# Patient Record
Sex: Male | Born: 2000 | Race: Black or African American | Hispanic: No | Marital: Single | Smoking: Never smoker
Health system: Southern US, Community
[De-identification: ages and names within clinical notes are randomized; demographics above are authoritative.]

---
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
## 2022-01-16 ENCOUNTER — Emergency Department (HOSPITAL_COMMUNITY)
Admission: EM | Admit: 2022-01-16 | Discharge: 2022-01-17 | Disposition: A | Discharge status: Home / Self Care | Payer: PRIVATE HEALTH INSURANCE | Attending: Emergency Medicine | Admitting: Emergency Medicine

## 2022-01-16 ENCOUNTER — Encounter (HOSPITAL_COMMUNITY): Payer: Self-pay | Admitting: *Deleted

## 2022-01-16 ENCOUNTER — Other Ambulatory Visit: Payer: Self-pay

## 2022-01-16 DIAGNOSIS — F321 Major depressive disorder, single episode, moderate: Secondary | ICD-10-CM | POA: Insufficient documentation

## 2022-01-16 DIAGNOSIS — F430 Acute stress reaction: Secondary | ICD-10-CM

## 2022-01-16 DIAGNOSIS — F4323 Adjustment disorder with mixed anxiety and depressed mood: Secondary | ICD-10-CM | POA: Diagnosis not present

## 2022-01-16 DIAGNOSIS — R45851 Suicidal ideations: Secondary | ICD-10-CM | POA: Insufficient documentation

## 2022-01-16 DIAGNOSIS — Z046 Encounter for general psychiatric examination, requested by authority: Secondary | ICD-10-CM | POA: Diagnosis present

## 2022-01-16 DIAGNOSIS — F4325 Adjustment disorder with mixed disturbance of emotions and conduct: Secondary | ICD-10-CM | POA: Insufficient documentation

## 2022-01-16 NOTE — ED Triage Notes (Signed)
Pt arrives via [employer] under IVC from magistrates office. Pt says that he has had "a lot of things built up and tonight I just let it all out". Pt is currently denying SI or HI. Report hx of si when he was younger. Not taking any medications on a daily basis. Alert and cooperative. ..   The respondent states that he is going to hurt himself. He states that he is going to walk out in the highway and hurt himself. The respondent has crying spells at times and states he is going to harm himself. He is not taking any medication at this time and not seeing  a doctor for any help. The respondent acted in a way that created a substantial risk of serious bodily harm and there is a reasonable probability that this conduct will be repeated.

## 2022-01-17 ENCOUNTER — Encounter (HOSPITAL_COMMUNITY): Payer: Self-pay | Admitting: Emergency Medicine

## 2022-01-17 DIAGNOSIS — F430 Acute stress reaction: Secondary | ICD-10-CM

## 2022-01-17 DIAGNOSIS — Z046 Encounter for general psychiatric examination, requested by authority: Secondary | ICD-10-CM

## 2022-01-17 DIAGNOSIS — F4323 Adjustment disorder with mixed anxiety and depressed mood: Secondary | ICD-10-CM

## 2022-01-17 LAB — SALICYLATE LEVEL: Salicylate Lvl: <7 mg/dL — ABNORMAL LOW (ref 7.0–30.0)

## 2022-01-17 LAB — CBC
HCT: 46.3 % (ref 39.0–52.0)
Hemoglobin: 15.1 g/dL (ref 13.0–17.0)
MCH: 28.5 pg (ref 26.0–34.0)
MCHC: 32.6 g/dL (ref 30.0–36.0)
MCV: 87.5 fL (ref 80.0–100.0)
Platelets: 257 10*3/uL (ref 150–400)
RBC: 5.29 MIL/uL (ref 4.22–5.81)
RDW: 12.6 % (ref 11.5–15.5)
WBC: 6.5 10*3/uL (ref 4.0–10.5)
nRBC: 0 % (ref 0.0–0.2)

## 2022-01-17 LAB — COMPREHENSIVE METABOLIC PANEL
ALT: 14 U/L (ref 0–44)
AST: 21 U/L (ref 15–41)
Albumin: 4.6 g/dL (ref 3.5–5.0)
Alkaline Phosphatase: 47 U/L (ref 38–126)
Anion gap: 7 (ref 5–15)
BUN: 12 mg/dL (ref 6–20)
CO2: 27 mmol/L (ref 22–32)
Calcium: 9.3 mg/dL (ref 8.9–10.3)
Chloride: 103 mmol/L (ref 98–111)
Creatinine, Ser: 1.09 mg/dL (ref 0.61–1.24)
GFR, Estimated: >60 mL/min (ref >60)
Glucose, Bld: 114 mg/dL — ABNORMAL HIGH (ref 70–99)
Potassium: 3.9 mmol/L (ref 3.5–5.1)
Sodium: 137 mmol/L (ref 135–145)
Total Bilirubin: 1 mg/dL (ref 0.3–1.2)
Total Protein: 7.7 g/dL (ref 6.5–8.1)

## 2022-01-17 LAB — RAPID URINE DRUG SCREEN, HOSP PERFORMED
Amphetamines: NOT DETECTED
Barbiturates: NOT DETECTED
Benzodiazepines: NOT DETECTED
Cocaine: NOT DETECTED
Opiates: NOT DETECTED
Tetrahydrocannabinol: NOT DETECTED

## 2022-01-17 LAB — ETHANOL: Alcohol, Ethyl (B): <10 mg/dL (ref <10)

## 2022-01-17 LAB — ACETAMINOPHEN LEVEL: Acetaminophen (Tylenol), Serum: <10 ug/mL — ABNORMAL LOW (ref 10–30)

## 2022-01-17 MED ORDER — ONDANSETRON 4 MG PO TBDP
4.0000 mg | ORAL_TABLET | Freq: Once | ORAL | Status: DC
  Filled 2022-01-17: qty 1

## 2022-01-17 NOTE — BH Assessment (Addendum)
Comprehensive Clinical Assessment (CCA) Note  01/17/2022 Greg Swanson [id number] Disposition: Clinician discussed patient care with Sindy Guadeloupe, NP.  He recommends patient to be observed overnight and be seen by psychiatry on 10/25.  Clinician informed Dr. Wallace Cullens of disposition recommendation via secure messaging.    Pt has good eye contact and is oriented x4.  Pt smiles during assessment and is responsive.  He does not appear to be responding to internal stimuli.  Patient does not evidence any delusional thought problems.  He is clear and coherent in his statements.  Patient reports that his appetite and sleep are WNL.  Patient does not have any current outpatient providers.   Chief Complaint:  Chief Complaint  Patient presents with   IVC   Visit Diagnosis: MDD single episode moderate    CCA Screening, Triage and Referral (STR)  Patient Reported Information How did you hear about [place]? Legal System  What Is the Reason for Your Visit/Call Today? Pt said that his mother became worried that he may harm himself.  Per IVC papers patient had said he would got to the highway to harm himself.  He had left the house upset and crying.  Patient has recently moved back home with mother after living on his own in [place].  Patient also had also broke up with a girlfriend today.  Patient says his mother was very concerned but he says that he never said anything to mother about wanting to harm himself.  He says he has never tried to harm himself.  Patient denies any HI or A/V hallucinations.  Patient denies any use of ETOH or marijuana.  Pt says he has no access to weapons.  His sleep is off due to not having to wake up early for his job.  Patient said he eats small meals througout the day.  How Long Has This Been Causing You Problems? <Week  What Do You Feel Would Help You the Most Today? Treatment for Depression or other mood problem   Have You Recently Had Any Thoughts About Hurting  Yourself? Yes  Are You Planning to Commit Suicide/Harm Yourself At This time? No   Have you Recently Had Thoughts About Hurting Someone Karolee Ohs? No  Are You Planning to Harm Someone at This Time? No  Explanation: No data recorded  Have You Used Any Alcohol or Drugs in the Past 24 Hours? No  How Long Ago Did You Use Drugs or Alcohol? No data recorded What Did You Use and How Much? No data recorded  Do You Currently Have a Therapist/Psychiatrist? No  Name of Therapist/Psychiatrist: No data recorded  Have You Been Recently Discharged From Any Office Practice or Programs? No  Explanation of Discharge From Practice/Program: No data recorded    CCA Screening Triage Referral Assessment Type of Contact: Tele-Assessment  Telemedicine Service Delivery:   Is this Initial or Reassessment? Initial Assessment  Date Telepsych consult ordered in [facility]:  01/17/22  Time Telepsych consult ordered in [facility]:  0110  Location of Assessment: [facility]  Provider Location: [facility] Assessment Services   Collateral Involvement: No data recorded  Does Patient Have a Court Appointed Legal Guardian? No  Legal Guardian Contact Information: No data recorded Copy of Legal Guardianship Form: No data recorded Legal Guardian Notified of Arrival: No data recorded Legal Guardian Notified of Pending Discharge: No data recorded If Minor and Not Living with Parent(s), Who has Custody? No data recorded Is CPS involved or ever been involved? Never  Is  APS involved or ever been involved? No data recorded  Patient Determined To Be At Risk for Harm To Self or Others Based on Review of Patient Reported Information or Presenting Complaint? Yes, for Self-Harm  Method: No data recorded Availability of Means: No data recorded Intent: No data recorded Notification Required: No data recorded Additional Information for Danger to Others Potential: No data recorded Additional Comments for Danger to Others Potential: No data  recorded Are There Guns or Other Weapons in Your Home? No data recorded Types of Guns/Weapons: No data recorded Are These Weapons Safely Secured?                            No data recorded Who Could Verify You Are Able To Have These Secured: No data recorded Do You Have any Outstanding Charges, Pending Court Dates, Parole/Probation? No data recorded Contacted To Inform of Risk of Harm To Self or Others: No data recorded   Does Patient Present under Involuntary Commitment? Yes  IVC Papers Initial File Date: 01/16/22   [place] of Residence: [place]   Patient Currently Receiving the Following Services: Not Receiving Services   Determination of Need: Urgent (48 hours)   Options For Referral: Other: Comment (Observe overnight and psychiatry to review on 10/25)     CCA Biopsychosocial Patient Reported Schizophrenia/Schizoaffective Diagnosis in Past: No   Strengths: No data recorded  Mental Health Symptoms Depression:   Change in energy/activity; Sleep (too much or little); Tearfulness; Fatigue   Duration of Depressive symptoms:  Duration of Depressive Symptoms: Greater than two weeks   Mania:   None   Anxiety:    Worrying; Sleep; Difficulty concentrating   Psychosis:   None   Duration of Psychotic symptoms:    Trauma:   None   Obsessions:   None   Compulsions:   None   Inattention:  No data recorded  Hyperactivity/Impulsivity:   None   Oppositional/Defiant Behaviors:   None   Emotional Irregularity:   Chronic feelings of emptiness   Other Mood/Personality Symptoms:  No data recorded   Mental Status Exam Appearance and self-care  Stature:   Average   Weight:   Average weight   Clothing:  No data recorded  Grooming:   Normal   Cosmetic use:   None   Posture/gait:   Normal   Motor activity:   Not Remarkable   Sensorium  Attention:   Normal   Concentration:   Anxiety interferes   Orientation:   X5   Recall/memory:    Normal   Affect and Mood  Affect:   Full Range   Mood:   Anxious   Relating  Eye contact:   Normal   Facial expression:   Responsive   Attitude toward examiner:   Cooperative   Thought and Language  Speech flow:  Clear and Coherent   Thought content:   Appropriate to Mood and Circumstances   Preoccupation:   None   Hallucinations:   None   Organization:   Coherent   [employer] of Knowledge:   Average   Intelligence:   Average   Abstraction:   Functional   Judgement:   Poor   Reality Testing:   Adequate   Insight:   Fair   Decision Making:   Impulsive   Social Functioning  Social Maturity:   Impulsive   Social Judgement:   Normal   Stress  Stressors:   Family conflict  Coping Ability:   Overwhelmed   Skill Deficits:   None   Supports:   Family     Religion: Religion/Spirituality Are You A Religious Person?: No  Leisure/Recreation: Leisure / Recreation Do You Have Hobbies?: No  Exercise/Diet: Exercise/Diet Do You Exercise?: No Have You Gained or Lost A Significant Amount of Weight in the Past Six Months?: No Do You Follow a Special Diet?: No Do You Have Any Trouble Sleeping?: No   CCA Employment/Education Employment/Work Situation: Employment / Work Situation Employment Situation: Unemployed Patient's Job has Been Impacted by Current Illness: No Has Patient ever Been in Passenger transport manager?: No  Education: Education Is Patient Currently Attending School?: No Last Grade Completed: 12 Did You Nutritional therapist?: No Did You Have An Individualized Education Program (IIEP): No Did You Have Any Difficulty At [employer]?: No Patient's Education Has Been Impacted by Current Illness: No   CCA Family/Childhood History Family and Relationship History: Family history Marital status: Single Does patient have children?: No  Childhood History:  Childhood History By whom was/is the patient raised?: Mother Did  patient suffer any verbal/emotional/physical/sexual abuse as a child?: No Did patient suffer from severe childhood neglect?: No Has patient ever been sexually abused/assaulted/raped as an adolescent or adult?: No Was the patient ever a victim of a crime or a disaster?: No Witnessed domestic violence?: No Has patient been affected by domestic violence as an adult?: No  Child/Adolescent Assessment:     CCA Substance Use Alcohol/Drug Use: Alcohol / Drug Use Pain Medications: None Prescriptions: Alergy mediation Over the Counter: None History of alcohol / drug use?: No history of alcohol / drug abuse                         ASAM's:  Six Dimensions of Multidimensional Assessment  Dimension 1:  Acute Intoxication and/or Withdrawal Potential:      Dimension 2:  Biomedical Conditions and Complications:      Dimension 3:  Emotional, Behavioral, or Cognitive Conditions and Complications:     Dimension 4:  Readiness to Change:     Dimension 5:  Relapse, Continued use, or Continued Problem Potential:     Dimension 6:  Recovery/Living Environment:     ASAM Severity Score:    ASAM Recommended Level of Treatment:     Substance use Disorder (SUD)    Recommendations for Services/Supports/Treatments:    Discharge Disposition:    DSM5 Diagnoses: Patient Active Problem List   Diagnosis Date Noted   Left hip pain 01/10/2015   Left knee pain 09/02/2013     Referrals to Alternative Service(s): Referred to Alternative Service(s):   Place:   Date:   Time:    Referred to Alternative Service(s):   Place:   Date:   Time:    Referred to Alternative Service(s):   Place:   Date:   Time:    Referred to Alternative Service(s):   Place:   Date:   Time:     Waldron Session

## 2022-01-17 NOTE — Discharge Instructions (Addendum)
It was our pleasure to provide your ER care today - we hope that you feel better.  Follow up closely with primary care doctor and behavioral health provider in the coming week.  For mental health issues and/or crisis, you may also go directly to the Behavioral [facility] Ur[facility] - they are open 24/7 and walk-ins are welcome.    Return to ER if worse, new symptoms, fevers, chest pain, trouble breathing, or other emergency concern.   

## 2022-01-17 NOTE — Consult Note (Signed)
[facility] ED ASSESSMENT   Reason for Consult:  IVC Referring Physician:  Wynona Dove, DO Patient Identification: Greg Swanson MRN:  [id number] ED Chief Complaint: Acute stress reaction with predominately emotional disturbance  Diagnosis:  Principal Problem:   Acute stress reaction with predominately emotional disturbance Active Problems:   Involuntary commitment   ED Assessment Time Calculation: Start Time: 0950 Stop Time: 1005 Total Time in Minutes (Assessment Completion): 15   Subjective:   Greg Swanson is a 21 y.o. male patient admitted with   HPI:   Patient reports that he moved to [place] from [place] approximately 3 weeks ago.  States that he is living with his mother.  States that his significant other broke up with him yesterday.  States that his mother became concerned because she was having difficulty Feeling him on his break-up.  He states that he left the house for approximately an hour to get something to eat and when he returned she took him to the police department because she was concerned.  He denies making any suicidal statements.  He denies making statements indicating that he wishes he was dead.  He denies a history of suicide attempts.  He denies a history of diagnosed psychiatric disorders.  He reports occasional depression and anxiety.  He denies anhedonia.  He denies issues with focusing and concentrating.  He denies racing thoughts outside of his break-up yesterday.  Jaydeen denies symptoms of mania or hypomania, including euphoric mood, sleeplessness, increased activity, impulsive/reckless behavior,  increased talkativeness, marked inability to focus and excessive self-confidence that have lasted for several consecutive days.  He denies use of alcohol, marijuana, cocaine, methamphetamine, and other illicit substances.  UDS negative.  BAL less than 10.  He expresses interest in therapy resources.  On evaluation, patient is alert and oriented x 4.  He is  neatly groomed.  Eye contact is good.  He is pleasant and cooperative.  Speech is clear and coherent.  He reports his mood this morning as euthymic.  Affect is congruent with mood.  Smiles appropriately.  Thought process is coherent, goal-directed, and linear.  Thought content is logical.  He denies auditory and visual hallucinations.  No indication that he is responding to internal stimuli.  No delusions elicited during this assessment.  He denies suicidal ideations.  He denies homicidal ideations.  Past Psychiatric History: Denies previous psychiatric history.   Risk to Self or Others: Is the patient at risk to self? Yes Has the patient been a risk to self in the past 6 months? No Has the patient been a risk to self within the distant past? No Is the patient a risk to others? No Has the patient been a risk to others in the past 6 months? No Has the patient been a risk to others within the distant past? No  [place] Scale:  [facility] ED from 01/16/2022 in [facility] DEPT  C-SSRS RISK CATEGORY Low Risk       AIMS:  , , ,  ,   ASAM:    Substance Abuse:  Alcohol / Drug Use Pain Medications: None Prescriptions: Alergy mediation Over the Counter: None History of alcohol / drug use?: No history of alcohol / drug abuse  Past Medical History: History reviewed. No pertinent past medical history. History reviewed. No pertinent surgical history. Family History: History reviewed. No pertinent family history. Family Psychiatric  History: Denies family history of Bipolar, Schizophrenia, and suicide.  Social History:  Social History  Substance and Sexual Activity  Alcohol Use No   Alcohol/week: 0.0 standard drinks of alcohol     Social History   Substance and Sexual Activity  Drug Use Not on file    Social History   Socioeconomic History   Marital status: Single    Spouse name: Not on file   Number of children: Not on file   Years of education:  Not on file   Highest education level: Not on file  Occupational History   Not on file  Tobacco Use   Smoking status: Never   Smokeless tobacco: Not on file  Substance and Sexual Activity   Alcohol use: No    Alcohol/week: 0.0 standard drinks of alcohol   Drug use: Not on file   Sexual activity: Not on file  Other Topics Concern   Not on file  Social History Narrative   ** Merged History Encounter **       Social Determinants of Health   Financial Resource Strain: Not on file  Food Insecurity: Not on file  Transportation Needs: Not on file  Physical Activity: Not on file  Stress: Not on file  Social Connections: Not on file   Additional Social History:    Allergies:   Allergies  Allergen Reactions   Penicillins Hives   Penicillins Hives    Has patient had a PCN reaction causing immediate rash, facial/tongue/throat swelling, SOB or lightheadedness with hypotension: No Has patient had a PCN reaction causing severe rash involving mucus membranes or skin necrosis: No Has patient had a PCN reaction that required hospitalization No Has patient had a PCN reaction occurring within the last 10 years: No If all of the above answers are "NO", then may proceed with Cephalosporin use.     Labs:  Results for orders placed or performed during the hospital encounter of 01/16/22 (from the past 48 hour(s))  Rapid urine drug screen (hospital performed)     Status: None   Collection Time: 01/16/22 12:30 AM  Result Value Ref Range   Opiates NONE DETECTED NONE DETECTED   Cocaine NONE DETECTED NONE DETECTED   Benzodiazepines NONE DETECTED NONE DETECTED   Amphetamines NONE DETECTED NONE DETECTED   Tetrahydrocannabinol NONE DETECTED NONE DETECTED   Barbiturates NONE DETECTED NONE DETECTED    Comment: (NOTE) DRUG SCREEN FOR MEDICAL PURPOSES ONLY.  IF CONFIRMATION IS NEEDED FOR ANY PURPOSE, NOTIFY LAB WITHIN 5 DAYS.  LOWEST DETECTABLE LIMITS FOR URINE DRUG SCREEN Drug Class                      Cutoff (ng/mL) Amphetamine and metabolites    1000 Barbiturate and metabolites    200 Benzodiazepine                 200 Opiates and metabolites        300 Cocaine and metabolites        300 THC                            50 Performed at [facility], [address] [place], [place] [address]   Comprehensive metabolic panel     Status: Abnormal   Collection Time: 01/17/22 12:15 AM  Result Value Ref Range   Sodium 137 135 - 145 mmol/L   Potassium 3.9 3.5 - 5.1 mmol/L   Chloride 103 98 - 111 mmol/L   CO2 27 22 - 32 mmol/L  Glucose, Bld 114 (H) 70 - 99 mg/dL    Comment: Glucose reference range applies only to samples taken after fasting for at least 8 hours.   BUN 12 6 - 20 mg/dL   Creatinine, Ser 1.09 0.61 - 1.24 mg/dL   Calcium 9.3 8.9 - 10.3 mg/dL   Total Protein 7.7 6.5 - 8.1 g/dL   Albumin 4.6 3.5 - 5.0 g/dL   AST 21 15 - 41 U/L   ALT 14 0 - 44 U/L   Alkaline Phosphatase 47 38 - 126 U/L   Total Bilirubin 1.0 0.3 - 1.2 mg/dL   GFR, Estimated >60 >60 mL/min    Comment: (NOTE) Calculated using the CKD-EPI Creatinine Equation (2021)    Anion gap 7 5 - 15    Comment: Performed at [facility], [place] [address] [place], [place] [address]  Ethanol     Status: None   Collection Time: 01/17/22 12:15 AM  Result Value Ref Range   Alcohol, Ethyl (B) <10 <10 mg/dL    Comment: (NOTE) Lowest detectable limit for serum alcohol is 10 mg/dL.  For medical purposes only. Performed at [facility], [place] [address], [place], [place] [address]   Salicylate level     Status: Abnormal   Collection Time: 01/17/22 12:15 AM  Result Value Ref Range   Salicylate Lvl <4.3 (L) 7.0 - 30.0 mg/dL    Comment: Performed at [facility], [place] [address], [place], [place] [address]  Acetaminophen level     Status: Abnormal   Collection Time: 01/17/22 12:15 AM  Result Value Ref Range   Acetaminophen (Tylenol), Serum  <10 (L) 10 - 30 ug/mL    Comment: (NOTE) Therapeutic concentrations vary significantly. A range of 10-30 ug/mL  may be an effective concentration for many patients. However, some  are best treated at concentrations outside of this range. Acetaminophen concentrations >150 ug/mL at 4 hours after ingestion  and >50 ug/mL at 12 hours after ingestion are often associated with  toxic reactions.  Performed at [facility], [place] [address], [place], [place] [address]   cbc     Status: None   Collection Time: 01/17/22 12:15 AM  Result Value Ref Range   WBC 6.5 4.0 - 10.5 K/uL   RBC 5.29 4.22 - 5.81 MIL/uL   Hemoglobin 15.1 13.0 - 17.0 g/dL   HCT 46.3 39.0 - 52.0 %   MCV 87.5 80.0 - 100.0 fL   MCH 28.5 26.0 - 34.0 pg   MCHC 32.6 30.0 - 36.0 g/dL   RDW 12.6 11.5 - 15.5 %   Platelets 257 150 - 400 K/uL   nRBC 0.0 0.0 - 0.2 %    Comment: Performed at [facility], [place] [address], [place], [place] [address]    No current facility-administered medications for this encounter.   Current Outpatient Medications  Medication Sig Dispense Refill   erythromycin ophthalmic ointment Place a 1/2 inch ribbon of ointment into the lower eyelid four times daily x 6 days (Patient not taking: Reported on 01/16/2022) 1 g 0   ibuprofen (ADVIL,MOTRIN) 600 MG tablet Take 1 tablet (600 mg total) by mouth every 6 (six) hours as needed. (Patient not taking: Reported on 01/16/2022) 30 tablet 0   methylphenidate 27 MG PO CR tablet Take 27 mg by mouth every morning. (Patient not taking: Reported on 01/16/2022)      Musculoskeletal: Strength & Muscle Tone: within normal limits Gait & Station:  normal Patient leans: N/A   Psychiatric Specialty Exam: Presentation  General Appearance:  Appropriate for Environment; Well Groomed  Eye Contact: Good  Speech: Clear and Coherent; Normal Rate  Speech Volume: Normal  Handedness:No data recorded  Mood and Affect   Mood: Euthymic  Affect: Congruent   Thought Process  Thought Processes: Coherent; Goal Directed; Linear  Descriptions of Associations:Intact  Orientation:Full (Time, Place and Person)  Thought Content:Logical  History of Schizophrenia/Schizoaffective disorder:No  Duration of Psychotic Symptoms:No data recorded Hallucinations:Hallucinations: None  Ideas of Reference:None  Suicidal Thoughts:Suicidal Thoughts: No  Homicidal Thoughts:Homicidal Thoughts: No   Sensorium  Memory: Immediate Good; Recent Good; Remote Good  Judgment: Fair  Insight: Fair   Community education officer  Concentration: Good  Attention Span: Good  Recall: Good  Fund of Knowledge: Good  Language: Good   Psychomotor Activity  Psychomotor Activity: Psychomotor Activity: Normal   Assets  Assets: Communication Skills; Desire for Improvement; Financial Resources/Insurance; Housing; Physical Health; Social Support    Sleep  Sleep: Sleep: Good   Physical Exam: Physical Exam Constitutional:      General: He is not in acute distress.    Appearance: He is not ill-appearing, toxic-appearing or diaphoretic.  HENT:     Right Ear: External ear normal.     Left Ear: External ear normal.  Eyes:     General:        Right eye: No discharge.        Left eye: No discharge.  Cardiovascular:     Rate and Rhythm: Normal rate.  Pulmonary:     Effort: Pulmonary effort is normal. No respiratory distress.  Musculoskeletal:        General: Normal range of motion.     Cervical back: Normal range of motion.  Neurological:     Mental Status: He is alert and oriented to person, place, and time.  Psychiatric:        Speech: Speech normal.        Behavior: Behavior is cooperative.        Thought Content: Thought content is not paranoid or delusional. Thought content does not include homicidal or suicidal ideation.    Review of Systems  Constitutional:  Negative for chills, diaphoresis,  fever, malaise/fatigue and weight loss.  Respiratory:  Negative for cough and shortness of breath.   Cardiovascular:  Negative for chest pain.  Gastrointestinal:  Negative for diarrhea, nausea and vomiting.  Neurological:  Negative for dizziness and seizures.  Psychiatric/Behavioral:  Positive for depression. Negative for hallucinations, memory loss, substance abuse and suicidal ideas. The patient is nervous/anxious. The patient does not have insomnia.    Blood pressure 126/84, pulse 90, temperature 98.9 F (37.2 C), temperature source Oral, resp. rate 20, height 5\' 6"  (1.676 m), weight 59 kg, SpO2 100 %. Body mass index is 20.98 kg/m.  Demographic Factors:  Male, Adolescent or young adult, and Unemployed  Loss Factors: Loss of significant relationship  Historical Factors: NA  Risk Reduction Factors:   Religious beliefs about death, Living with another person, especially a relative, and Positive social support  Continued Clinical Symptoms:  Reports depression/anxiety at times. Currently denies depression/anxiety.  Cognitive Features That Contribute To Risk:  None    Suicide Risk:  Mild:  Suicidal ideation of limited frequency, intensity, duration, and specificity.  There are no identifiable plans, no associated intent, mild dysphoria and related symptoms, good self-control (both objective and subjective assessment), few other risk factors, and identifiable protective factors, including available and accessible social  support.    Disposition: After patient was evaluated by this provider, it was learned that the EDP Dr. Ashok Cordia had rescinded the IVC and discharged the patient.    Lindon Romp, APRN, FNP-C, PMHNP-BC [place]  01/17/2022 11:22 AM

## 2022-01-17 NOTE — ED Provider Notes (Signed)
Emergency Medicine Observation Re-evaluation Note  Greg Swanson is a 21 y.o. male, seen on rounds today.  Pt initially presented to the ED for complaints of IVC Pt reports feeling fine/improved, and indicates he never had plan or desire to harm self. Pt indicates he was experiencing feeling upset/stress regarding break up of a relationship, but that he never had plan or wish to harm or kill self or others. He indicates he was upset, and that parent was going to call the hospital, and he didn't feel that was necessary so her walked out. Pt denies hx mental health illness. Indicates feels fine, normal appetite, sleeps fine at night. Denies recent physical illness.   Physical Exam  BP 126/84 (BP Location: Left Arm)   Pulse 90   Temp 98.9 F (37.2 C) (Oral)   Resp 20   Ht 1.676 m (5\' 6" )   Wt 59 kg   SpO2 100%   BMI 20.98 kg/m  Physical Exam General: alert, content.  Cardiac: regular rate. Lungs: breathing comfortably. Psych: pt exhibits normal mood and affect. Pt does not appear acutely depressed or despondent. He is conversant, pleasant, smiles, expresses thoughts clearly. No thoughts or plan to harm self or others. Pt is not responding to internal stimuli - no delusions or hallucinations are noted. No acute psychosis.   ED Course / MDM    I have reviewed the labs performed to date as well as medications administered while in observation.  Recent changes in the last 24 hours include ED obs, reassessment.   Plan  Pt without acute psychosis, acute depression, or thoughts of harm to self or others.   IVC rescinded. Paperwork given to staff.   Pt is offered outpatient [facility].   Pt currently appears stable for d/c.       Lajean Saver, MD 01/17/22 [phone]

## 2022-01-17 NOTE — ED Provider Notes (Signed)
[facility] DEPT Provider Note   CSN: [id number] Arrival date & time: 01/16/22  2323     History  Chief Complaint  Patient presents with   IVC    Greg Swanson is a 21 y.o. male.  Patient as above with significant medical history as below, including no sig med hx who presents to the ED with complaint of IVC. Pt reports recent increase in social stressors, recently lost his job, moved back home with mother, and earlier today had a breakup. Pt at this time denies and SI or HI, no recent etoh or illicit substance use. Mother did take pt to police department as she was concerned that he was a danger to himself, report that pt had reported he was going to run out into traffic to harm himself.  Pt denies this plan, denies any history of self harm or previous suicide attempt. Otherwise he has no acute complaints.    History reviewed. No pertinent past medical history.  History reviewed. No pertinent surgical history.   The history is provided by the patient. No language interpreter was used.       Home Medications Prior to Admission medications   Medication Sig Start Date End Date Taking? Authorizing Provider  erythromycin ophthalmic ointment Place a 1/2 inch ribbon of ointment into the lower eyelid four times daily x 6 days Patient not taking: Reported on 01/16/2022 06/30/15   [place], PA-C  ibuprofen (ADVIL,MOTRIN) 600 MG tablet Take 1 tablet (600 mg total) by mouth every 6 (six) hours as needed. Patient not taking: Reported on 01/16/2022 12/21/14   Charlann Lange, PA-C  methylphenidate 27 MG PO CR tablet Take 27 mg by mouth every morning. Patient not taking: Reported on 01/16/2022    [provider]      Allergies    Penicillins and Penicillins    Review of Systems   Review of Systems  Constitutional:  Negative for chills and fever.  HENT:  Negative for facial swelling and trouble swallowing.   Eyes:  Negative for  photophobia and visual disturbance.  Respiratory:  Negative for cough and shortness of breath.   Cardiovascular:  Negative for chest pain and palpitations.  Gastrointestinal:  Negative for abdominal pain, nausea and vomiting.  Endocrine: Negative for polydipsia and polyuria.  Genitourinary:  Negative for difficulty urinating and hematuria.  Musculoskeletal:  Negative for gait problem and joint swelling.  Skin:  Negative for pallor and rash.  Neurological:  Negative for syncope and headaches.  Psychiatric/Behavioral:  Positive for sleep disturbance. Negative for agitation and confusion. The patient is nervous/anxious.     Physical Exam Updated Vital Signs BP 126/84 (BP Location: Left Arm)   Pulse 90   Temp 98.9 F (37.2 C) (Oral)   Resp 20   Ht 5\' 6"  (1.676 m)   Wt 59 kg   SpO2 100%   BMI 20.98 kg/m  Physical Exam Vitals and nursing note reviewed.  Constitutional:      General: He is not in acute distress.    Appearance: He is well-developed.  HENT:     Head: Normocephalic and atraumatic.     Right Ear: External ear normal.     Left Ear: External ear normal.     Mouth/Throat:     Mouth: Mucous membranes are moist.  Eyes:     General: No scleral icterus. Cardiovascular:     Rate and Rhythm: Normal rate and regular rhythm.     Pulses: Normal  pulses.     Heart sounds: Normal heart sounds.  Pulmonary:     Effort: Pulmonary effort is normal. No respiratory distress.     Breath sounds: Normal breath sounds.  Abdominal:     General: Abdomen is flat.     Palpations: Abdomen is soft.     Tenderness: There is no abdominal tenderness.  Musculoskeletal:        General: Normal range of motion.     Cervical back: Normal range of motion.     Right lower leg: No edema.     Left lower leg: No edema.  Skin:    General: Skin is warm and dry.     Capillary Refill: Capillary refill takes less than 2 seconds.  Neurological:     Mental Status: He is alert and oriented to person,  place, and time.     GCS: GCS eye subscore is 4. GCS verbal subscore is 5. GCS motor subscore is 6.  Psychiatric:        Attention and Perception: He does not perceive auditory or visual hallucinations.        Mood and Affect: Mood is depressed.        Speech: Speech normal.        Behavior: Behavior normal. Behavior is cooperative.        Thought Content: Thought content does not include homicidal or suicidal ideation. Thought content does not include homicidal or suicidal plan.     ED Results / Procedures / Treatments   Labs (all labs ordered are listed, but only abnormal results are displayed) Labs Reviewed  COMPREHENSIVE METABOLIC PANEL - Abnormal; Notable for the following components:      Result Value   Glucose, Bld 114 (*)    All other components within normal limits  SALICYLATE LEVEL - Abnormal; Notable for the following components:   Salicylate Lvl <7.0 (*)    All other components within normal limits  ACETAMINOPHEN LEVEL - Abnormal; Notable for the following components:   Acetaminophen (Tylenol), Serum <10 (*)    All other components within normal limits  ETHANOL  CBC  RAPID URINE DRUG SCREEN, HOSP PERFORMED    EKG EKG Interpretation  Date/Time:  Wednesday January 17 2022 00:51:23 EDT Ventricular Rate:  72 PR Interval:  154 QRS Duration: 97 QT Interval:  353 QTC Calculation: 387 R Axis:   78 Text Interpretation: Sinus rhythm ST elev, probable normal early repol pattern No old tracing to compare no stemi Early repolarization Confirmed by Tanda Rockers ([id number]) on 01/17/2022 2:13:28 AM  Radiology No results found.  Procedures Procedures    Medications Ordered in ED Medications - No data to display  ED Course/ Medical Decision Making/ A&P                           Medical Decision Making Amount and/or Complexity of Data Reviewed Labs: ordered.   This patient presents to the ED with chief complaint(s) of IVC, suicidal with pertinent past medical history  of above which further complicates the presenting complaint. The complaint involves an extensive differential diagnosis and also carries with it a high risk of complications and morbidity.    The differential diagnosis includes but not limited to psychosis, substance induced, mental health disturbance, etc. Serious etiologies were considered.   The initial plan is to screening labs/ecg/tts   Additional history obtained: Additional history obtained from  na Records reviewed  prior ed visits, prior pcp notes  home meds, prior labs/imaging   Independent labs interpretation:  The following labs were independently interpreted:  Labs stable Apap, salicylate, ethanol were negative UDS negative CMP and CBC were stable  Independent visualization of imaging:  Pulse oximetry 100% on room air  Treatment and Reassessment: symptoms stated this time TTS eval  Consultation: - Consulted or discussed management/test interpretation w/ external professional: TTS > recommend continued observation and re-assess in the AM  Consideration for admission or further workup: Admission was considered   Plan for reassessment in the morning.  Patient is medically cleared.  He is not acutely psychotic.    Social Determinants of health: Social History   Tobacco Use   Smoking status: Never  Substance Use Topics   Alcohol use: No    Alcohol/week: 0.0 standard drinks of alcohol            Final Clinical Impression(s) / ED Diagnoses Final diagnoses:  Suicidal thoughts    Rx / DC Orders ED Discharge Orders     None         Sloan Leiter, DO 01/17/22 0932

## 2022-01-17 NOTE — ED Notes (Signed)
Pt belonging bag x1 placed in triage pt belonging cabinet

## 2023-02-04 ENCOUNTER — Ambulatory Visit
Admission: EM | Admit: 2023-02-04 | Discharge: 2023-02-04 | Disposition: A | Discharge status: Home / Self Care | Payer: PRIVATE HEALTH INSURANCE | Attending: Family Medicine | Admitting: Family Medicine

## 2023-02-04 DIAGNOSIS — R519 Headache, unspecified: Secondary | ICD-10-CM | POA: Diagnosis not present

## 2023-02-04 MED ORDER — TIZANIDINE HCL 4 MG PO TABS: 4.0000 mg | ORAL_TABLET | Freq: Three times a day (TID) | ORAL | 0 refills | Status: AC | PRN

## 2023-02-04 MED ORDER — IBUPROFEN 600 MG PO TABS: 600.0000 mg | ORAL_TABLET | Freq: Four times a day (QID) | ORAL | 0 refills | Status: AC | PRN

## 2023-02-04 NOTE — Discharge Instructions (Signed)
You were seen today for headache.  I have sent out a motrin and a muscle relaxer.  The muscle relaxer could make you tired/sleepy so please take when home and not driving.  You may use heat/ice as well.  Please return if not improving as expected.

## 2023-02-04 NOTE — ED Triage Notes (Signed)
Pt reports he has slight left side teeth pain and a bad headache after eating a piece of chicken x 1day  Took tylenol

## 2023-02-04 NOTE — ED Provider Notes (Signed)
[facility]    CSN: [id number] Arrival date & time: 02/04/23  0849      History   Chief Complaint Chief Complaint  Patient presents with   Headache    HPI Greg Swanson is a 22 y.o. male.    Headache  Yesterday he bit into a chicken bone, and sent a pain up the top of the teeth, into the head/face area.  At this time the teeth are slightly painful 3/10, but he has most pain to the face/head, about 7/10.  He has taken tylenol last night, not sure if helpful.  He woke up in pain.  He did not notice any part of the tooth that is broken, etc.  He was a bit nauseated last night, no vomiting.  He does have migraines.        History reviewed. No pertinent past medical history.  Patient Active Problem List   Diagnosis Date Noted   Adjustment disorder with mixed anxiety and depressed mood 01/17/2022   Involuntary commitment 01/17/2022   Acute stress reaction with predominately emotional disturbance    Left hip pain 01/10/2015   Left knee pain 09/02/2013    History reviewed. No pertinent surgical history.     Home Medications    Prior to Admission medications   Medication Sig Start Date End Date Taking? Authorizing Provider  erythromycin ophthalmic ointment Place a 1/2 inch ribbon of ointment into the lower eyelid four times daily x 6 days Patient not taking: Reported on 01/16/2022 06/30/15   Melton Krebs, PA-C  ibuprofen (ADVIL,MOTRIN) 600 MG tablet Take 1 tablet (600 mg total) by mouth every 6 (six) hours as needed. Patient not taking: Reported on 01/16/2022 12/21/14   Elpidio Anis, PA-C  methylphenidate 27 MG PO CR tablet Take 27 mg by mouth every morning. Patient not taking: Reported on 01/16/2022    [provider]    Family History History reviewed. No pertinent family history.  Social History Social History   Tobacco Use   Smoking status: Never  Vaping Use   Vaping status: Never Used  Substance Use Topics    Alcohol use: No    Alcohol/week: 0.0 standard drinks of alcohol   Drug use: Yes    Types: Marijuana     Allergies   Penicillins and Penicillins   Review of Systems Review of Systems  Constitutional: Negative.   HENT: Negative.    Respiratory: Negative.    Cardiovascular: Negative.   Gastrointestinal: Negative.   Musculoskeletal: Negative.   Neurological:  Positive for headaches.     Physical Exam Triage Vital Signs ED Triage Vitals [02/04/23 0900]  Encounter Vitals Group     BP 124/84     Systolic BP Percentile      Diastolic BP Percentile      Pulse Rate 79     Resp 16     Temp 99.1 F (37.3 C)     Temp Source Oral     SpO2 97 %     Weight      Height      Head Circumference      Peak Flow      Pain Score 7     Pain Loc      Pain Education      Exclude from Growth Chart    No data found.  Updated Vital Signs BP 124/84 (BP Location: Left Arm)   Pulse 79   Temp 99.1 F (37.3 C) (Oral)  Resp 16   SpO2 97%   Visual Acuity Right Eye Distance:   Left Eye Distance:   Bilateral Distance:    Right Eye Near:   Left Eye Near:    Bilateral Near:     Physical Exam Constitutional:      Appearance: He is well-developed.  HENT:     Head: Normocephalic and atraumatic.     Comments: He does have TTP to the left side of the head, just lateral to the eye;  no temporal artery tenderness    Mouth/Throat:     Mouth: Mucous membranes are moist.     Dentition: Normal dentition. No dental tenderness, dental abscesses or gum lesions.  Eyes:     Extraocular Movements: Extraocular movements intact.     Pupils: Pupils are equal, round, and reactive to light.  Cardiovascular:     Rate and Rhythm: Normal rate.     Heart sounds: Normal heart sounds.  Pulmonary:     Effort: Pulmonary effort is normal.     Breath sounds: Normal breath sounds.  Neurological:     Mental Status: He is alert and oriented to person, place, and time.  Psychiatric:        Mood and Affect:  Mood normal.      UC Treatments / Results  Labs (all labs ordered are listed, but only abnormal results are displayed) Labs Reviewed - No data to display  EKG   Radiology No results found.  Procedures Procedures (including critical care time)  Medications Ordered in UC Medications - No data to display  Initial Impression / Assessment and Plan / UC Course  I have reviewed the triage vital signs and the nursing notes.  Pertinent labs & imaging results that were available during my care of the patient were reviewed by me and considered in my medical decision making (see chart for details).   Final Clinical Impressions(s) / UC Diagnoses   Final diagnoses:  Nonintractable headache, unspecified chronicity pattern, unspecified headache type     Discharge Instructions      You were seen today for headache.  I have sent out a motrin and a muscle relaxer.  The muscle relaxer could make you tired/sleepy so please take when home and not driving.  You may use heat/ice as well.  Please return if not improving as expected.     ED Prescriptions     Medication Sig Dispense Auth. Provider   ibuprofen (ADVIL) 600 MG tablet Take 1 tablet (600 mg total) by mouth every 6 (six) hours as needed. 30 tablet Jaymien Landin, MD   tiZANidine (ZANAFLEX) 4 MG tablet Take 1 tablet (4 mg total) by mouth every 8 (eight) hours as needed for muscle spasms. 30 tablet Jannifer Franklin, MD      PDMP not reviewed this encounter.   Jannifer Franklin, MD 02/04/23 [phone]

## 2023-07-06 ENCOUNTER — Ambulatory Visit
Admission: EM | Admit: 2023-07-06 | Discharge: 2023-07-06 | Disposition: A | Discharge status: Home / Self Care | Payer: PRIVATE HEALTH INSURANCE

## 2023-07-06 DIAGNOSIS — J029 Acute pharyngitis, unspecified: Secondary | ICD-10-CM | POA: Diagnosis not present

## 2023-07-06 DIAGNOSIS — J302 Other seasonal allergic rhinitis: Secondary | ICD-10-CM

## 2023-07-06 DIAGNOSIS — J0100 Acute maxillary sinusitis, unspecified: Secondary | ICD-10-CM | POA: Diagnosis not present

## 2023-07-06 MED ORDER — CHLORPHEN-PE-ACETAMINOPHEN 4-10-325 MG PO TABS: 1.0000 | ORAL_TABLET | Freq: Three times a day (TID) | ORAL | 0 refills | Status: AC

## 2023-07-06 MED ORDER — PREDNISONE 20 MG PO TABS: 40.0000 mg | ORAL_TABLET | Freq: Every day | ORAL | 0 refills | Status: AC

## 2023-07-06 MED ORDER — FLUTICASONE PROPIONATE 50 MCG/ACT NA SUSP: 2.0000 | Freq: Every day | NASAL | 0 refills | Status: AC

## 2023-07-06 MED ORDER — AZITHROMYCIN 500 MG PO TABS: 500.0000 mg | ORAL_TABLET | Freq: Every day | ORAL | 0 refills | Status: AC

## 2023-07-06 NOTE — ED Triage Notes (Signed)
 Pt states nausea, congestion, runny nose, sore throat and sinus pressure for the past 5 days.

## 2023-07-06 NOTE — ED Provider Notes (Signed)
 [facility]    CSN: [id number] Arrival date & time: 07/06/23  1348      History   Chief Complaint Chief Complaint  Patient presents with   Nasal Congestion    HPI Greg Swanson is a 23 y.o. male.   Subjective:   Greg Swanson. Greg Swanson is a 23 year old male presenting for evaluation of possible sinus infection. He reports sinus pain and pressure, headache, nasal congestion, runny nose, sore throat, sneezing, nausea, decreased appetite, and a mild cough. He denies fever, chills, body aches, vomiting, or diarrhea. He states his appetite is decreased, but he is drinking fluids fairly well. He has no personal history of pneumonia or bronchitis and is a non-smoker. He denies any contacts that have not tried anything for his symptoms.  The following portions of the patient's history were reviewed and updated as appropriate: allergies, current medications, past family history, past medical history, past social history, past surgical history, and problem list.     History reviewed. No pertinent past medical history.  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 No pertinent surgical history.     Home Medications    Prior to Admission medications   Medication Sig Start Date End Date Taking? Authorizing Provider  azithromycin (ZITHROMAX) 500 MG tablet Take 1 tablet (500 mg total) by mouth daily for 5 days. 07/06/23 07/11/23 Yes Maryruth Sol, FNP  Chlorphen-PE-Acetaminophen 4-10-325 MG TABS Take 1 tablet by mouth 3 (three)  times daily. 07/06/23  Yes Eleena Grater, FNP  fluticasone (FLONASE) 50 MCG/ACT nasal spray Place 2 sprays into both nostrils daily. 07/06/23  Yes Lisha Vitale, FNP  ketoconazole (NIZORAL) 2 % shampoo Apply 1 Application topically as directed. 03/01/23  Yes [provider]  pantoprazole (PROTONIX) 40 MG tablet Take 1 tablet by mouth daily. 03/08/22  Yes [provider]  predniSONE (DELTASONE) 20 MG tablet Take 2 tablets (40 mg total) by mouth daily for 5 days. 07/06/23 07/11/23 Yes [place]on Yearby, FNP  triamcinolone (KENALOG) 0.025 % cream Apply 1 Application topically daily. 03/01/23  Yes [provider]  ibuprofen (ADVIL) 600 MG tablet Take 1 tablet (600 mg total) by mouth every 6 (six) hours as needed. 02/04/23   Piontek, [place] Dales, MD  tiZANidine (ZANAFLEX) 4 MG tablet Take 1 tablet (4 mg total) by mouth every 8 (eight) hours as needed for muscle spasms. 02/04/23   Lesle Ras, MD    Family History History reviewed. No pertinent family history.  Social History Social History   Tobacco Use   Smoking status: Never  Vaping Use   Vaping status: Never Used  Substance Use Topics   Alcohol use: No    Alcohol/week: 0.0 standard drinks of alcohol   Drug use: Yes    Types: Marijuana     Allergies   Penicillins, Penicillins, Grass pollen([id number] vern), and Other   Review of Systems Review of Systems  Constitutional:  Positive for fatigue. Negative for appetite change, chills, diaphoresis and fever.  HENT:  Positive for congestion, postnasal drip, rhinorrhea, sinus pressure, sinus pain, sneezing and sore throat. Negative for  ear pain.   Respiratory:  Positive for cough. Negative for shortness of breath and wheezing.   Gastrointestinal:  Positive for diarrhea and nausea. Negative for vomiting.  Musculoskeletal:  Positive for myalgias.  Neurological:  Positive for headaches.  All other systems reviewed and are negative.    Physical Exam Triage  Vital Signs ED Triage Vitals  Encounter Vitals Group     BP 07/06/23 1359 116/77     Systolic BP Percentile --      Diastolic BP Percentile --      Pulse Rate 07/06/23 1359 (!) 53     Resp 07/06/23 1359 16     Temp 07/06/23 1359 98.5 F (36.9 C)     Temp Source 07/06/23 1359 Oral     SpO2 07/06/23 1359 96 %     Weight --      Height --      Head Circumference --      Peak Flow --      Pain Score 07/06/23 1358 7     Pain Loc --      Pain Education --      Exclude from Growth Chart --    No data found.  Updated Vital Signs BP 116/77 (BP Location: Left Arm)   Pulse (!) 53   Temp 98.5 F (36.9 C) (Oral)   Resp 16   SpO2 96%   Visual Acuity Right Eye Distance:   Left Eye Distance:   Bilateral Distance:    Right Eye Near:   Left Eye Near:    Bilateral Near:     Physical Exam Vitals reviewed.  Constitutional:      General: He is awake. He is not in acute distress.    Appearance: Normal appearance. He is well-developed and normal weight. He is not ill-appearing, toxic-appearing or diaphoretic.  HENT:     Head: Normocephalic.     Right Ear: Hearing, tympanic membrane, ear canal and external ear normal. No swelling or tenderness. No middle ear effusion. Tympanic membrane is not erythematous.     Left Ear: Hearing, tympanic membrane, ear canal and external ear normal. No swelling or tenderness.  No middle ear effusion. Tympanic membrane is not erythematous.     Nose: Congestion present.     Right Sinus: Maxillary sinus tenderness present.     Left Sinus: Maxillary sinus tenderness present.     Mouth/Throat:     Lips: Pink.     Mouth: Mucous membranes are moist.     Pharynx: Oropharynx is clear. Uvula midline. Posterior oropharyngeal erythema present. No pharyngeal swelling, oropharyngeal exudate or uvula swelling.     Tonsils: No tonsillar exudate or tonsillar abscesses.  Pulmonary:     Effort: Pulmonary effort is normal. No tachypnea.     Breath sounds: Normal breath  sounds and air entry.  Musculoskeletal:     Cervical back: Full passive range of motion without pain, normal range of motion and neck supple.  Lymphadenopathy:     Cervical: Cervical adenopathy present.  Neurological:     Mental Status: He is alert.      UC Treatments / Results  Labs (all labs ordered are listed, but only abnormal results are displayed) Labs Reviewed - No data to display  EKG   Radiology No results found.  Procedures Procedures (including critical care time)  Medications Ordered in UC Medications - No data to display  Initial Impression / Assessment and Plan / UC Course  I have reviewed the triage vital  signs and the nursing notes.  Pertinent labs & imaging results that were available during my care of the patient were reviewed by me and considered in my medical decision making (see chart for details).    23 year old male presenting with sinus pain and pressure, headache, nasal congestion, runny nose, sore throat, sneezing, nausea, decreased appetite, and mild cough. He denies fever, chills, body aches, vomiting, or diarrhea. Patient is alert and oriented, afebrile, and appears uncomfortable but nontoxic. Physical exam reveals nasal congestion, bilateral maxillary sinus tenderness, posterior oropharyngeal erythema, and cervical adenopathy. Presentation is consistent with acute sinusitis. Prescriptions provided for Zithromax, prednisone, Norel AD, and Flonase. Supportive care measures reviewed. Advised to follow up with primary care provider if symptoms do not improve.  Today's evaluation has revealed no signs of a dangerous process. Discussed diagnosis with patient and/or guardian. Patient and/or guardian aware of their diagnosis, possible red flag symptoms to watch out for and need for close follow up. Patient and/or guardian understands verbal and written discharge instructions. Patient and/or guardian comfortable with plan and disposition.  Patient and/or  guardian has a clear mental status at this time, good insight into illness (after discussion and teaching) and has clear judgment to make decisions regarding their care  Documentation was completed with the aid of voice recognition software. Transcription may contain typographical errors. Final Clinical Impressions(s) / UC Diagnoses   Final diagnoses:  Acute non-recurrent maxillary sinusitis  Seasonal allergies  Acute sore throat     Discharge Instructions      You have sinus infection. A sinus infection, also called sinusitis, is inflammation of your sinuses. Take medications as prescribed. Make sure you finish all the antibiotics even if you start to feel better. You may also take tylenol and/or ibuprofen as needed for pain and/or fever. Use Flonase daily. Drink enough fluid to keep your urine pale yellow. Staying hydrated will also help to thin your mucus. Use a cool mist humidifier to keep the humidity level in your home above 50%. Inhale steam for 10-15 minutes, 3-4 times a day. You can do this in the bathroom while a hot shower is running and/or purchase over-the-counter vapor shower tablets which is great to help with nasal congestion.Try to limit your exposure to cool or dry air. Sleep with your head raised to decrease post-nasal drainage. Make sure you get enough sleep each night. Change your toothbrush after finishing the antibiotics.      ED Prescriptions     Medication Sig Dispense Auth. Provider   azithromycin (ZITHROMAX) 500 MG tablet Take 1 tablet (500 mg total) by mouth daily for 5 days. 5 tablet Maryruth Sol, FNP   predniSONE (DELTASONE) 20 MG tablet Take 2 tablets (40 mg total) by mouth daily for 5 days. 10 tablet Maryruth Sol, FNP   Chlorphen-PE-Acetaminophen 4-10-325 MG TABS Take 1 tablet by mouth 3 (three) times daily. 21 tablet Laretta Pyatt, FNP   fluticasone (FLONASE) 50 MCG/ACT nasal spray Place 2 sprays into both nostrils daily. 16 g Maryruth Sol, FNP      PDMP not reviewed this encounter.   Maryruth Sol, [place] 07/06/23 1551

## 2023-07-06 NOTE — Discharge Instructions (Addendum)
You have sinus infection. A sinus infection, also called sinusitis, is inflammation of your sinuses. Take medications as prescribed. Make sure you finish all the antibiotics even if you start to feel better. You may also take tylenol and/or ibuprofen as needed for pain and/or fever. Use Flonase daily. Drink enough fluid to keep your urine pale yellow. Staying hydrated will also help to thin your mucus. Use a cool mist humidifier to keep the humidity level in your home above 50%. Inhale steam for 10-15 minutes, 3-4 times a day. You can do this in the bathroom while a hot shower is running and/or purchase over-the-counter vapor shower tablets which is great to help with nasal congestion.Try to limit your exposure to cool or dry air. Sleep with your head raised to decrease post-nasal drainage. Make sure you get enough sleep each night. Change your toothbrush after finishing the antibiotics.  

## 2023-07-13 ENCOUNTER — Encounter: Payer: Self-pay | Admitting: Emergency Medicine

## 2023-07-13 ENCOUNTER — Ambulatory Visit
Admission: EM | Admit: 2023-07-13 | Discharge: 2023-07-13 | Disposition: A | Discharge status: Home / Self Care | Payer: PRIVATE HEALTH INSURANCE | Attending: Family Medicine | Admitting: Family Medicine

## 2023-07-13 DIAGNOSIS — R519 Headache, unspecified: Secondary | ICD-10-CM | POA: Diagnosis not present

## 2023-07-13 DIAGNOSIS — R110 Nausea: Secondary | ICD-10-CM

## 2023-07-13 MED ORDER — IBUPROFEN 800 MG PO TABS
800.0000 mg | ORAL_TABLET | Freq: Once | ORAL | Status: AC
  Administered 2023-07-13: 800 mg via ORAL

## 2023-07-13 MED ORDER — ONDANSETRON 4 MG PO TBDP
4.0000 mg | ORAL_TABLET | Freq: Once | ORAL | Status: AC
  Administered 2023-07-13: 4 mg via ORAL

## 2023-07-13 NOTE — Discharge Instructions (Addendum)
 If the medications given to you this afternoon do not help, return tomorrow and we can give you the injections of Toradol and Decadron we discussed.  Meds ordered this encounter  Medications   ibuprofen  (ADVIL ) tablet 800 mg   ondansetron  (ZOFRAN -ODT) disintegrating tablet 4 mg

## 2023-07-13 NOTE — ED Triage Notes (Signed)
 Pt c/o constant migraine x 9 days. Pt reports it is light sensitive.

## 2023-07-15 NOTE — ED Provider Notes (Signed)
 [facility]   [id number] 07/13/23 Arrival Time: 1416  ASSESSMENT & PLAN:  1. Bad headache   2. Mild nausea    Declines IM decadron/toradol.  Trial of: Meds ordered this encounter  Medications   ibuprofen  (ADVIL ) tablet 800 mg   ondansetron  (ZOFRAN -ODT) disintegrating tablet 4 mg   Normal neurological exam. Without fever, focal neuro logical deficits, nuchal rigidity, or change in vision. No indication for neurodiagnostic workup at this time. Discussed.  Recommend:  Follow-up Information     [facility]) In 1 day.   Specialty: Urgent Care Why: If worsening or failing to improve as anticipated. Contact information: [address] [place]  [address] [phone]                 Reviewed expectations re: course of current medical issues. Questions answered. Outlined signs and symptoms indicating need for more acute intervention. Patient verbalized understanding. After Visit Summary given.   SUBJECTIVE: History from: Greg Swanson Patient is able to give a clear and coherent history.  Greg Swanson is a 23 y.o. male who presents with complaint of a bad generalized HA; on/off over past 9 days; now more frontal. Denies any recent illnesses. Similar headaches in past but usu don't last this long. With photophobia. Denies depression, dizziness, loss of balance, muscle weakness, numbness of extremities, speech difficulties, and vision problems. No head injury reported. Ambulatory without difficulty. No recent travel.  ROS: As per HPI. All other systems negative.    OBJECTIVE:  Vitals:   07/13/23 1437 07/13/23 1439  BP:  126/65  Pulse:  75  Resp:  16  Temp:  98.2 F (36.8 C)  TempSrc:  Oral  SpO2:  96%  Weight: 63.5 kg   Height: 5\' 6"  (1.676 m)     General appearance: alert; NAD HENT: normocephalic; atraumatic Eyes: PERRLA; EOMI; conjunctivae normal Neck: supple with  FROM Lungs: clear to auscultation bilaterally; unlabored respirations Heart: regular rate and rhythm Extremities: no edema; symmetrical with no gross deformities Skin: warm and dry Neurologic: alert; speech is fluent and clear without dysarthria or aphasia; CN 2-12 grossly intact; no facial droop; normal gait; normal symmetric reflexes; normal extremity strength and sensation throughout; bilateral upper and lower extremity sensation is grossly intact with 5/5 symmetric strength; normal grip strength; normal plantar and dorsi flexion bilaterally Psychological: alert and cooperative; normal mood and affect   Allergies  Allergen Reactions   Penicillins Hives    Has patient had a PCN reaction causing immediate rash, facial/tongue/throat swelling, SOB or lightheadedness with hypotension: No  Has patient had a PCN reaction causing severe rash involving mucus membranes or skin necrosis: No  Has patient had a PCN reaction that required hospitalization No  Has patient had a PCN reaction occurring within the last 10 years: No  If all of the above answers are "NO", then may proceed with Cephalosporin use.  Has patient had a PCN reaction causing immediate rash, facial/tongue/throat swelling, SOB or lightheadedness with hypotension: No Has patient had a PCN reaction causing severe rash involving mucus membranes or skin necrosis: No Has patient had a PCN reaction that required hospitalization No Has patient had a PCN reaction occurring within the last 10 years: No If all of the above answers are "NO", then may proceed with Cephalosporin use.  Has patient had a PCN reaction causing immediate rash, facial/tongue/throat swelling, SOB or lightheadedness with hypotension: No  Has patient had a PCN reaction causing  severe rash involving mucus membranes or skin necrosis: No  Has patient had a PCN reaction that required hospitalization No  Has patient had a PCN reaction occurring within the last 10 years: No   If all of the above answers are "NO", then may proceed with Cephalosporin use.  Has patient had a PCN reaction causing immediate rash, facial/tongue/throat swelling, SOB or lightheadedness with hypotension: No Has patient had a PCN reaction causing severe rash involving mucus membranes or skin necrosis: No Has patient had a PCN reaction that required hospitalization No Has patient had a PCN reaction occurring within the last 10 years: No If all of the above answers are "NO", then may proceed with Cephalosporin use.    Has patient had a PCN reaction causing immediate rash, facial/tongue/throat swelling, SOB or lightheadedness with hypotension: No Has patient had a PCN reaction causing severe rash involving mucus membranes or skin necrosis: No Has patient had a PCN reaction that required hospitalization No Has patient had a PCN reaction occurring within the last 10 years: No If all of the above answers are "NO", then may proceed with Cephalosporin use.   Penicillins Hives   Grass Pollen(K-O-R-T-Swt Vern) Rash   Other Rash    Tree's and Shrubs.    History reviewed. No pertinent past medical history. Social History   Socioeconomic History   Marital status: Single    Spouse name: Not on file   Number of children: Not on file   Years of education: Not on file   Highest education level: Not on file  Occupational History   Not on file  Tobacco Use   Smoking status: Never    Passive exposure: Current   Smokeless tobacco: Current  Vaping Use   Vaping status: Never Used  Substance and Sexual Activity   Alcohol use: No    Alcohol/week: 0.0 standard drinks of alcohol   Drug use: Yes    Types: Marijuana   Sexual activity: Not on file  Other Topics Concern   Not on file  Social History Narrative   ** Merged History Encounter **       Social Drivers of Corporate investment banker Strain: Not on file  Food Insecurity: Not on file  Transportation Needs: Not on file  Physical Activity: Not  on file  Stress: Not on file  Social Connections: Unknown (07/28/2021)   Received from [facility], [facility]   Social Network    Social Network: Not on file  Intimate Partner Violence: Unknown (07/28/2021)   Received from [facility], [facility]   HITS    Physically Hurt: Not on file    Insult or Talk Down To: Not on file    Threaten Physical Harm: Not on file    Scream or Curse: Not on file   History reviewed. No pertinent family history. History reviewed. No pertinent surgical history.    Afton Albright, MD 07/15/23 1059

## 2024-03-02 ENCOUNTER — Emergency Department (HOSPITAL_COMMUNITY)
Admission: EM | Admit: 2024-03-02 | Discharge: 2024-03-03 | Disposition: A | Payer: PRIVATE HEALTH INSURANCE | Attending: Emergency Medicine | Admitting: Emergency Medicine

## 2024-03-02 ENCOUNTER — Emergency Department (HOSPITAL_COMMUNITY): Payer: PRIVATE HEALTH INSURANCE

## 2024-03-02 ENCOUNTER — Other Ambulatory Visit: Payer: Self-pay

## 2024-03-02 DIAGNOSIS — F419 Anxiety disorder, unspecified: Secondary | ICD-10-CM

## 2024-03-02 DIAGNOSIS — T50905A Adverse effect of unspecified drugs, medicaments and biological substances, initial encounter: Secondary | ICD-10-CM

## 2024-03-02 MED ORDER — SODIUM CHLORIDE 0.9 % IV BOLUS
500.0000 mL | Freq: Once | INTRAVENOUS | Status: AC
  Administered 2024-03-02: 500 mL via INTRAVENOUS

## 2024-03-02 NOTE — ED Triage Notes (Signed)
 Pt states that PTA he used a wax marijuana pen and is now having palpitations. Pt denies CP or SOB.

## 2024-03-03 LAB — I-STAT CHEM 8, ED
BUN: 14 mg/dL (ref 6–20)
Calcium, Ion: 1.06 mmol/L — ABNORMAL LOW (ref 1.15–1.40)
Chloride: 101 mmol/L (ref 98–111)
Creatinine, Ser: 1.3 mg/dL — ABNORMAL HIGH (ref 0.61–1.24)
Glucose, Bld: 125 mg/dL — ABNORMAL HIGH (ref 70–99)
HCT: 50 % (ref 39.0–52.0)
Hemoglobin: 17 g/dL (ref 13.0–17.0)
Potassium: 3.8 mmol/L (ref 3.5–5.1)
Sodium: 139 mmol/L (ref 135–145)
TCO2: 25 mmol/L (ref 22–32)

## 2024-03-03 LAB — CBC WITH DIFFERENTIAL/PLATELET
Abs Immature Granulocytes: 0.01 K/uL (ref 0.00–0.07)
Basophils Absolute: 0.1 K/uL (ref 0.0–0.1)
Basophils Relative: 1 %
Eosinophils Absolute: 0.1 K/uL (ref 0.0–0.5)
Eosinophils Relative: 1 %
HCT: 48.8 % (ref 39.0–52.0)
Hemoglobin: 15.9 g/dL (ref 13.0–17.0)
Immature Granulocytes: 0 %
Lymphocytes Relative: 59 %
Lymphs Abs: 4 K/uL (ref 0.7–4.0)
MCH: 28.6 pg (ref 26.0–34.0)
MCHC: 32.6 g/dL (ref 30.0–36.0)
MCV: 87.9 fL (ref 80.0–100.0)
Monocytes Absolute: 0.5 K/uL (ref 0.1–1.0)
Monocytes Relative: 8 %
Neutro Abs: 2.1 K/uL (ref 1.7–7.7)
Neutrophils Relative %: 31 %
Platelets: 285 K/uL (ref 150–400)
RBC: 5.55 MIL/uL (ref 4.22–5.81)
RDW: 12.2 % (ref 11.5–15.5)
WBC: 6.7 K/uL (ref 4.0–10.5)
nRBC: 0 % (ref 0.0–0.2)

## 2024-03-03 LAB — RAPID URINE DRUG SCREEN, HOSP PERFORMED
Amphetamines: NOT DETECTED
Barbiturates: NOT DETECTED
Benzodiazepines: NOT DETECTED
Cocaine: NOT DETECTED
Opiates: NOT DETECTED
Tetrahydrocannabinol: POSITIVE — AB

## 2024-03-03 MED ORDER — SODIUM CHLORIDE 0.9 % IV BOLUS
500.0000 mL | Freq: Once | INTRAVENOUS | Status: AC
  Administered 2024-03-03: 500 mL via INTRAVENOUS

## 2024-03-03 MED ORDER — HALOPERIDOL LACTATE 5 MG/ML IJ SOLN
2.0000 mg | Freq: Once | INTRAMUSCULAR | Status: AC
  Administered 2024-03-03: 2 mg via INTRAVENOUS
  Filled 2024-03-03: qty 1

## 2024-03-03 NOTE — ED Provider Notes (Signed)
 [place] EMERGENCY DEPARTMENT AT [facility] Provider Note   CSN: [id number] Arrival date & time: 03/02/24  2316     Patient presents with: Palpitations and Anxiety   Greg Swanson is a 23 y.o. male.   The history is provided by the patient.  Palpitations Palpitations quality:  Fast Onset quality:  Sudden Timing:  Constant Progression:  Unchanged Chronicity:  Recurrent Context comment:  Marijuana vape pen, this happened last time he used as well Relieved by:  Nothing Worsened by:  Nothing Ineffective treatments:  None tried Associated symptoms: no chest pain, no chest pressure, no cough, no nausea, no numbness, no PND, no shortness of breath, no syncope, no vomiting and no weakness   Risk factors: no diabetes mellitus, no hx of PE and no hyperthyroidism   Anxiety This is a recurrent problem. The problem occurs constantly. The problem has not changed since onset.Pertinent negatives include no chest pain and no shortness of breath. Nothing aggravates the symptoms. Nothing relieves the symptoms. He has tried nothing for the symptoms. The treatment provided no relief.       Prior to Admission medications   Medication Sig Start Date End Date Taking? Authorizing Provider  Chlorphen-PE-Acetaminophen  4-10-325 MG TABS Take 1 tablet by mouth 3 (three) times daily. 07/06/23   Murrill, Samantha, FNP  fluticasone  (FLONASE ) 50 MCG/ACT nasal spray Place 2 sprays into both nostrils daily. 07/06/23   Murrill, Samantha, FNP  ibuprofen  (ADVIL ) 600 MG tablet Take 1 tablet (600 mg total) by mouth every 6 (six) hours as needed. 02/04/23   Piontek, Rocky, MD  ketoconazole (NIZORAL) 2 % shampoo Apply 1 Application topically as directed. 03/01/23   [provider]  pantoprazole (PROTONIX) 40 MG tablet Take 1 tablet by mouth daily. 03/08/22   [provider]  tiZANidine  (ZANAFLEX ) 4 MG tablet Take 1 tablet (4 mg total) by mouth every 8 (eight) hours as needed for muscle  spasms. 02/04/23   Piontek, Rocky, MD  triamcinolone (KENALOG) 0.025 % cream Apply 1 Application topically daily. 03/01/23   [provider]    Allergies: Penicillins, Penicillins, Grass pollen([id number]-swt vern), and Other    Review of Systems  Respiratory:  Negative for cough and shortness of breath.   Cardiovascular:  Positive for palpitations. Negative for chest pain, syncope and PND.  Gastrointestinal:  Negative for nausea and vomiting.  Neurological:  Negative for weakness and numbness.  Psychiatric/Behavioral:  Negative for self-injury, sleep disturbance and suicidal ideas. The patient is nervous/anxious.     Updated Vital Signs BP (!) 111/59   Pulse 97   Temp 97.9 F (36.6 C)   Resp 18   SpO2 98%   Physical Exam Vitals and nursing note reviewed.  Constitutional:      General: He is not in acute distress.    Appearance: He is well-developed. He is not diaphoretic.  HENT:     Head: Normocephalic and atraumatic.     Nose: Nose normal.  Eyes:     Conjunctiva/sclera: Conjunctivae normal.     Pupils: Pupils are equal, round, and reactive to light.  Cardiovascular:     Rate and Rhythm: Regular rhythm. Tachycardia present.     Pulses: Normal pulses.     Heart sounds: Normal heart sounds.  Pulmonary:     Effort: Pulmonary effort is normal.     Breath sounds: Normal breath sounds. No wheezing or rales.  Abdominal:     General: Bowel sounds are normal.     Palpations:  Abdomen is soft.     Tenderness: There is no abdominal tenderness. There is no guarding or rebound.  Musculoskeletal:        General: Normal range of motion.     Cervical back: Normal range of motion and neck supple.  Skin:    General: Skin is warm and dry.     Capillary Refill: Capillary refill takes less than 2 seconds.  Neurological:     General: No focal deficit present.     Mental Status: He is alert and oriented to person, place, and time.     Deep Tendon Reflexes: Reflexes normal.   Psychiatric:        Thought Content: Thought content normal. Thought content does not include homicidal or suicidal ideation. Thought content does not include homicidal or suicidal plan.     (all labs ordered are listed, but only abnormal results are displayed) Results for orders placed or performed during the hospital encounter of 03/02/24  CBC with Differential   Collection Time: 03/02/24 11:35 PM  Result Value Ref Range   WBC 6.7 4.0 - 10.5 K/uL   RBC 5.55 4.22 - 5.81 MIL/uL   Hemoglobin 15.9 13.0 - 17.0 g/dL   HCT 51.1 60.9 - 47.9 %   MCV 87.9 80.0 - 100.0 fL   MCH 28.6 26.0 - 34.0 pg   MCHC 32.6 30.0 - 36.0 g/dL   RDW 87.7 88.4 - 84.4 %   Platelets 285 150 - 400 K/uL   nRBC 0.0 0.0 - 0.2 %   Neutrophils Relative % 31 %   Neutro Abs 2.1 1.7 - 7.7 K/uL   Lymphocytes Relative 59 %   Lymphs Abs 4.0 0.7 - 4.0 K/uL   Monocytes Relative 8 %   Monocytes Absolute 0.5 0.1 - 1.0 K/uL   Eosinophils Relative 1 %   Eosinophils Absolute 0.1 0.0 - 0.5 K/uL   Basophils Relative 1 %   Basophils Absolute 0.1 0.0 - 0.1 K/uL   Immature Granulocytes 0 %   Abs Immature Granulocytes 0.01 0.00 - 0.07 K/uL  I-stat chem 8, ED (not at [facility] or [facility])   Collection Time: 03/03/24 12:00 AM  Result Value Ref Range   Sodium 139 135 - 145 mmol/L   Potassium 3.8 3.5 - 5.1 mmol/L   Chloride 101 98 - 111 mmol/L   BUN 14 6 - 20 mg/dL   Creatinine, Ser 8.69 (H) 0.61 - 1.24 mg/dL   Glucose, Bld 874 (H) 70 - 99 mg/dL   Calcium, Ion 8.93 (L) 1.15 - 1.40 mmol/L   TCO2 25 22 - 32 mmol/L   Hemoglobin 17.0 13.0 - 17.0 g/dL   HCT 49.9 60.9 - 47.9 %  Rapid urine drug screen (hospital performed)   Collection Time: 03/03/24  2:19 AM  Result Value Ref Range   Opiates NONE DETECTED NONE DETECTED   Cocaine NONE DETECTED NONE DETECTED   Benzodiazepines NONE DETECTED NONE DETECTED   Amphetamines NONE DETECTED NONE DETECTED   Tetrahydrocannabinol POSITIVE (A) NONE DETECTED   Barbiturates NONE DETECTED NONE  DETECTED   DG Chest Portable 1 View Result Date: 03/03/2024 EXAM: 1 VIEW XRAY OF THE CHEST 03/03/2024 12:04:33 AM COMPARISON: None available. CLINICAL HISTORY: tachyd FINDINGS: LUNGS AND PLEURA: No focal pulmonary opacity. No pleural effusion. No pneumothorax. HEART AND MEDIASTINUM: No acute abnormality of the cardiac and mediastinal silhouettes. BONES AND SOFT TISSUES: No acute osseous abnormality. IMPRESSION: 1. No acute cardiopulmonary process. Electronically signed by: Dorethia Molt MD 03/03/2024 12:08 AM  EST RP Workstation: [id number]     EKG: EKG Interpretation Date/Time:  Tuesday March 03 2024 02:29:59 EST Ventricular Rate:  113 PR Interval:  144 QRS Duration:  78 QT Interval:  281 QTC Calculation: 386 R Axis:   76  Text Interpretation: Sinus tachycardia Consider left atrial enlargement Confirmed by Chanise Habeck ([id number]) on 03/03/2024 3:50:58 AM  Radiology: [facility] Chest Portable 1 View Result Date: 03/03/2024 EXAM: 1 VIEW XRAY OF THE CHEST 03/03/2024 12:04:33 AM COMPARISON: None available. CLINICAL HISTORY: tachyd FINDINGS: LUNGS AND PLEURA: No focal pulmonary opacity. No pleural effusion. No pneumothorax. HEART AND MEDIASTINUM: No acute abnormality of the cardiac and mediastinal silhouettes. BONES AND SOFT TISSUES: No acute osseous abnormality. IMPRESSION: 1. No acute cardiopulmonary process. Electronically signed by: Dorethia Molt MD 03/03/2024 12:08 AM EST RP Workstation: [id number]     Procedures   Medications Ordered in the ED  sodium chloride  0.9 % bolus 500 mL (0 mLs Intravenous Stopped 03/03/24 0030)  sodium chloride  0.9 % bolus 500 mL (0 mLs Intravenous Stopped 03/03/24 0225)  haloperidol  lactate (HALDOL ) injection 2 mg (2 mg Intravenous Given 03/03/24 0235)                                    Medical Decision Making Patient used marijuana pen and is now tachy and anxious   Amount and/or Complexity of Data Reviewed External Data Reviewed: notes.    Details:  Previous notes reviewed  Labs: ordered.    Details: UDS is positive for THC, normal white count 6.7, hemoglobin 15.8, normal platelets.  Normal sodium 138, normal potassium 3.8, creatinine 1.3  Radiology: ordered and independent interpretation performed.    Details: Normal CXR by me  ECG/medicine tests: ordered and independent interpretation performed.  Risk Prescription drug management. Risk Details: Hydrated in the ED.  Given haldol  for anxiety.  HR is now normal.  Advised against further use of THC.  Patient verbalizes understanding.  Stable for discharge with close follow up.       Final diagnoses:  Anxiety  Adverse effect of drug, initial encounter    No signs of systemic illness or infection. The patient is nontoxic-appearing on exam and vital signs are within normal limits.  I have reviewed the triage vital signs and the nursing notes. Pertinent labs & imaging results that were available during my care of the patient were reviewed by me and considered in my medical decision making (see chart for details). After history, exam, and medical workup I feel the patient has been appropriately medically screened and is safe for discharge home. Pertinent diagnoses were discussed with the patient. Patient was given return precautions.    ED Discharge Orders     None          Oree Mirelez, MD 03/03/24 [id number]
